# Patient Record
Sex: Female | Born: 1990 | Race: Black or African American | Hispanic: No | Marital: Married | Smoking: Never smoker
Health system: Southern US, Community
[De-identification: ages and names within clinical notes are randomized; demographics above are authoritative.]

## PROBLEM LIST (undated history)

## (undated) DIAGNOSIS — N83201 Unspecified ovarian cyst, right side: Secondary | ICD-10-CM

## (undated) DIAGNOSIS — K589 Irritable bowel syndrome without diarrhea: Secondary | ICD-10-CM

## (undated) DIAGNOSIS — D219 Benign neoplasm of connective and other soft tissue, unspecified: Secondary | ICD-10-CM

## (undated) DIAGNOSIS — N83202 Unspecified ovarian cyst, left side: Secondary | ICD-10-CM

---
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
## 2010-08-01 ENCOUNTER — Encounter: Payer: Self-pay | Admitting: Family Medicine

## 2010-09-26 LAB — URINALYSIS, ROUTINE W REFLEX MICROSCOPIC
Ketones, ur: NEGATIVE mg/dL
Nitrite: NEGATIVE
Protein, ur: NEGATIVE mg/dL
pH: 5.5 (ref 5.0–8.0)

## 2010-09-26 LAB — RAPID STREP SCREEN (MED CTR MEBANE ONLY): Streptococcus, Group A Screen (Direct): NEGATIVE

## 2010-09-28 LAB — URINALYSIS, ROUTINE W REFLEX MICROSCOPIC
Bilirubin Urine: NEGATIVE
Glucose, UA: NEGATIVE mg/dL
Hgb urine dipstick: NEGATIVE
Ketones, ur: NEGATIVE mg/dL
Protein, ur: NEGATIVE mg/dL
pH: 5.5 (ref 5.0–8.0)

## 2010-09-28 LAB — URINE CULTURE: Colony Count: 60000

## 2010-09-29 LAB — URINALYSIS, ROUTINE W REFLEX MICROSCOPIC
Bilirubin Urine: NEGATIVE
Glucose, UA: NEGATIVE mg/dL
Ketones, ur: NEGATIVE mg/dL
Nitrite: NEGATIVE
Specific Gravity, Urine: 1.02 (ref 1.005–1.030)
pH: 6 (ref 5.0–8.0)

## 2010-10-12 LAB — WET PREP, GENITAL: Clue Cells Wet Prep HPF POC: NONE SEEN

## 2010-10-14 LAB — CBC
HCT: 34.8 % — ABNORMAL LOW (ref 36.0–46.0)
HCT: 35.9 % — ABNORMAL LOW (ref 36.0–46.0)
Hemoglobin: 12.1 g/dL (ref 12.0–15.0)
MCHC: 34.5 g/dL (ref 30.0–36.0)
MCV: 82.2 fL (ref 78.0–100.0)
Platelets: 173 10*3/uL (ref 150–400)
Platelets: 206 10*3/uL (ref 150–400)
RBC: 4.36 MIL/uL (ref 3.87–5.11)
RDW: 13.9 % (ref 11.5–15.5)
RDW: 14.1 % (ref 11.5–15.5)
WBC: 8.5 10*3/uL (ref 4.0–10.5)

## 2010-10-14 LAB — URINE MICROSCOPIC-ADD ON

## 2010-10-14 LAB — URINALYSIS, ROUTINE W REFLEX MICROSCOPIC
Hgb urine dipstick: NEGATIVE
Ketones, ur: NEGATIVE mg/dL
Nitrite: NEGATIVE
Protein, ur: NEGATIVE mg/dL
Specific Gravity, Urine: 1.009 (ref 1.005–1.030)
Urobilinogen, UA: 1 mg/dL (ref 0.0–1.0)
pH: 7.5 (ref 5.0–8.0)

## 2010-10-14 LAB — RPR: RPR Ser Ql: NONREACTIVE

## 2010-10-14 LAB — WET PREP, GENITAL
Trich, Wet Prep: NONE SEEN
Yeast Wet Prep HPF POC: NONE SEEN

## 2010-10-14 LAB — URINE CULTURE

## 2010-10-14 LAB — HCG, QUANTITATIVE, PREGNANCY: hCG, Beta Chain, Quant, S: 4170 m[IU]/mL — ABNORMAL HIGH (ref <5)

## 2012-03-20 ENCOUNTER — Encounter (HOSPITAL_COMMUNITY): Payer: Self-pay | Admitting: Family Medicine

## 2012-03-20 ENCOUNTER — Emergency Department (HOSPITAL_COMMUNITY)
Admission: EM | Admit: 2012-03-20 | Discharge: 2012-03-20 | Disposition: A | Discharge status: Home / Self Care | Payer: Self-pay | Attending: Emergency Medicine | Admitting: Emergency Medicine

## 2012-03-20 DIAGNOSIS — B9689 Other specified bacterial agents as the cause of diseases classified elsewhere: Secondary | ICD-10-CM | POA: Insufficient documentation

## 2012-03-20 DIAGNOSIS — K589 Irritable bowel syndrome without diarrhea: Secondary | ICD-10-CM | POA: Insufficient documentation

## 2012-03-20 DIAGNOSIS — N390 Urinary tract infection, site not specified: Secondary | ICD-10-CM | POA: Insufficient documentation

## 2012-03-20 DIAGNOSIS — A499 Bacterial infection, unspecified: Secondary | ICD-10-CM | POA: Insufficient documentation

## 2012-03-20 DIAGNOSIS — N760 Acute vaginitis: Secondary | ICD-10-CM | POA: Insufficient documentation

## 2012-03-20 HISTORY — DX: Irritable bowel syndrome, unspecified: K58.9

## 2012-03-20 HISTORY — DX: Unspecified ovarian cyst, left side: N83.202

## 2012-03-20 HISTORY — DX: Unspecified ovarian cyst, left side: N83.201

## 2012-03-20 HISTORY — DX: Benign neoplasm of connective and other soft tissue, unspecified: D21.9

## 2012-03-20 LAB — WET PREP, GENITAL

## 2012-03-20 LAB — URINALYSIS, ROUTINE W REFLEX MICROSCOPIC
Glucose, UA: NEGATIVE mg/dL
Nitrite: NEGATIVE
pH: 6.5 (ref 5.0–8.0)

## 2012-03-20 LAB — URINE MICROSCOPIC-ADD ON

## 2012-03-20 LAB — POCT PREGNANCY, URINE: Preg Test, Ur: NEGATIVE

## 2012-03-20 MED ORDER — NITROFURANTOIN MONOHYD MACRO 100 MG PO CAPS: 100.0000 mg | ORAL_CAPSULE | Freq: Two times a day (BID) | ORAL | Status: AC

## 2012-03-20 MED ORDER — METRONIDAZOLE 500 MG PO TABS
2000.0000 mg | ORAL_TABLET | Freq: Once | ORAL | Status: AC
  Administered 2012-03-20: 2000 mg via ORAL
  Filled 2012-03-20: qty 4

## 2012-03-20 MED ORDER — PHENAZOPYRIDINE HCL 200 MG PO TABS: 200.0000 mg | ORAL_TABLET | Freq: Three times a day (TID) | ORAL | Status: AC

## 2012-03-20 MED ORDER — NITROFURANTOIN MONOHYD MACRO 100 MG PO CAPS
100.0000 mg | ORAL_CAPSULE | Freq: Once | ORAL | Status: AC
  Administered 2012-03-20: 100 mg via ORAL
  Filled 2012-03-20: qty 1

## 2012-03-20 MED ORDER — ONDANSETRON HCL 4 MG PO TABS
4.0000 mg | ORAL_TABLET | Freq: Once | ORAL | Status: AC
  Administered 2012-03-20: 4 mg via ORAL
  Filled 2012-03-20: qty 1

## 2012-03-20 MED ORDER — IBUPROFEN 800 MG PO TABS
800.0000 mg | ORAL_TABLET | Freq: Once | ORAL | Status: AC
  Administered 2012-03-20: 800 mg via ORAL
  Filled 2012-03-20: qty 1

## 2012-03-20 MED ORDER — PHENAZOPYRIDINE HCL 200 MG PO TABS
200.0000 mg | ORAL_TABLET | Freq: Three times a day (TID) | ORAL | Status: DC
  Administered 2012-03-20: 200 mg via ORAL
  Filled 2012-03-20: qty 1

## 2012-03-20 NOTE — ED Notes (Signed)
Reports nasal congestion, abdominal pain, urinary urgency, and blood when wiping x 2 days.  Reports N/V as well. States she is constipated, lbm x 2 days ago.

## 2012-03-20 NOTE — ED Provider Notes (Signed)
History     CSN: [id number]  Arrival date & time 03/20/12  1656   First MD Initiated Contact with Patient 03/20/12 1740      Chief Complaint  Patient presents with  . Nasal Congestion  . Urinary Urgency    (Consider location/radiation/quality/duration/timing/severity/associated sxs/prior treatment) HPI Julie Mathis is a 21 y.o. female presenting with mild-moderate abdominal pain, urgency, dysuria, frequency and blood noted on wiping x 2 days, symptoms have been constant.  She's had occasional nausea and vomiting but has been able to tolerate fluids.  History of constipation, last BM 2 days ago.    Past Medical History  Diagnosis Date  . IBS (irritable bowel syndrome)   . Bilateral ovarian cysts   . Fibroids     History reviewed. No pertinent past surgical history.  History reviewed. No pertinent family history.  History  Substance Use Topics  . Smoking status: Never Smoker   . Smokeless tobacco: Not on file  . Alcohol Use: Yes     occasionally    OB History    Grav Para Term Preterm Abortions TAB SAB Ect Mult Living                  Review of Systems At least 10pt or greater review of systems completed and are negative except where specified in the HPI.   Allergies  Review of patient's allergies indicates no known allergies.  Home Medications  No current outpatient prescriptions on file.  BP 113/75  Pulse 91  Temp 98.2 F (36.8 C) (Oral)  Resp 18  SpO2 100%  LMP 03/07/2012  Physical Exam  Nursing notes reviewed.  Electronic medical record reviewed. VITAL SIGNS:   Filed Vitals:   03/20/12 1704  BP: 113/75  Pulse: 91  Temp: 98.2 F (36.8 C)  TempSrc: Oral  Resp: 18  SpO2: 100%   CONSTITUTIONAL: Awake, oriented, appears non-toxic HENT: Atraumatic, normocephalic, oral mucosa pink and moist, airway patent. Nares patent without drainage. External ears normal. EYES: Conjunctiva clear, EOMI, PERRLA NECK: Trachea midline, non-tender,  supple CARDIOVASCULAR: Normal heart rate, Normal rhythm, No murmurs, rubs, gallops PULMONARY/CHEST: Clear to auscultation, no rhonchi, wheezes, or rales. Symmetrical breath sounds. Non-tender. ABDOMINAL: Non-distended, soft, non-tender - no rebound or guarding.  BS normal. NEUROLOGIC: Non-focal, moving all four extremities, no gross sensory or motor deficits. EXTREMITIES: No clubbing, cyanosis, or edema SKIN: Warm, Dry, No erythema, No rash Pelvic exam: normal external genitalia, vulva, vagina, cervix, uterus and adnexa.   ED Course  Procedures (including critical care time)  Labs Reviewed  URINALYSIS, ROUTINE W REFLEX MICROSCOPIC - Abnormal; Notable for the following:    APPearance TURBID (*)     Hgb urine dipstick LARGE (*)     Protein, ur 100 (*)     Leukocytes, UA LARGE (*)     All other components within normal limits  URINE MICROSCOPIC-ADD ON - Abnormal; Notable for the following:    Bacteria, UA FEW (*)     All other components within normal limits  WET PREP, GENITAL - Abnormal; Notable for the following:    Clue Cells Wet Prep HPF POC MANY (*)     WBC, Wet Prep HPF POC MANY (*)     All other components within normal limits  GC/CHLAMYDIA PROBE AMP, GENITAL - Abnormal; Notable for the following:    Chlamydia, DNA Probe POSITIVE (*)     All other components within normal limits  POCT PREGNANCY, URINE  URINE CULTURE  LAB REPORT - SCANNED  No results found.   1. UTI (lower urinary tract infection)   2. Bacterial vaginosis       MDM  [name] is a 21 y.o. female presenting with UTI and likely bacterial vaginosis.  Will treat for both.  PElvic exam not convinving for PID, swabs sent, with UTI present will wait for results of DNA probe before any further therapy.  Pt non-toxic. I explained the diagnosis and have given explicit precautions to return to the ER including high fever, severe abdominal pain, unresolving symptoms or any other new or worsening symptoms. The  patient understands and accepts the medical plan as it's been dictated and I have answered their questions. Discharge instructions concerning home care and prescriptions have been given.  The patient is STABLE and is discharged to home in good condition.   Medications  nitrofurantoin, macrocrystal-monohydrate, (MACROBID) 100 MG capsule (not administered)  phenazopyridine (PYRIDIUM) 200 MG tablet (not administered)  ondansetron (ZOFRAN) tablet 4 mg (4 mg Oral Given 03/20/12 1843)  ibuprofen (ADVIL,MOTRIN) tablet 800 mg (800 mg Oral Given 03/20/12 1844)  nitrofurantoin (macrocrystal-monohydrate) (MACROBID) capsule 100 mg (100 mg Oral Given 03/20/12 2035)  metroNIDAZOLE (FLAGYL) tablet 2,000 mg (2000 mg Oral Given 03/20/12 2219)      Jones Skene, MD 03/24/12 1756

## 2012-03-21 LAB — GC/CHLAMYDIA PROBE AMP, GENITAL: Chlamydia, DNA Probe: POSITIVE — AB

## 2012-03-22 NOTE — ED Notes (Signed)
+  Chlamydia Chart sent to EDP office for review.  

## 2012-03-23 LAB — URINE CULTURE: Colony Count: 80000

## 2012-03-24 NOTE — ED Notes (Signed)
+  Urine. Patient treated with Macrobid. Sensitive to same. Per protocol MD. °

## 2012-03-30 ENCOUNTER — Telehealth (HOSPITAL_COMMUNITY): Payer: Self-pay | Admitting: *Deleted

## 2012-03-30 NOTE — ED Notes (Signed)
Patient informed of positive results after id'd x 2 and informed of need to notify partner to be treated. Rx for Azithromycin 1 gram sig:one tablet po once # 1 written by Emilia Beck need to be called to pharmacy

## 2012-03-30 NOTE — ED Notes (Signed)
Rx called to pharmacy by [facility].

## 2013-08-03 ENCOUNTER — Encounter (HOSPITAL_COMMUNITY): Payer: Self-pay | Admitting: Emergency Medicine

## 2013-08-03 ENCOUNTER — Emergency Department (HOSPITAL_COMMUNITY)
Admission: EM | Admit: 2013-08-03 | Discharge: 2013-08-03 | Disposition: A | Discharge status: Home / Self Care | Payer: 59 | Attending: Emergency Medicine | Admitting: Emergency Medicine

## 2013-08-03 ENCOUNTER — Emergency Department (HOSPITAL_COMMUNITY): Payer: 59

## 2013-08-03 DIAGNOSIS — Z79899 Other long term (current) drug therapy: Secondary | ICD-10-CM | POA: Insufficient documentation

## 2013-08-03 DIAGNOSIS — S0003XA Contusion of scalp, initial encounter: Secondary | ICD-10-CM | POA: Insufficient documentation

## 2013-08-03 DIAGNOSIS — S1093XA Contusion of unspecified part of neck, initial encounter: Principal | ICD-10-CM

## 2013-08-03 DIAGNOSIS — S0083XA Contusion of other part of head, initial encounter: Secondary | ICD-10-CM

## 2013-08-03 DIAGNOSIS — Y9389 Activity, other specified: Secondary | ICD-10-CM | POA: Insufficient documentation

## 2013-08-03 DIAGNOSIS — Y92009 Unspecified place in unspecified non-institutional (private) residence as the place of occurrence of the external cause: Secondary | ICD-10-CM | POA: Insufficient documentation

## 2013-08-03 DIAGNOSIS — IMO0002 Reserved for concepts with insufficient information to code with codable children: Secondary | ICD-10-CM | POA: Insufficient documentation

## 2013-08-03 DIAGNOSIS — F411 Generalized anxiety disorder: Secondary | ICD-10-CM | POA: Insufficient documentation

## 2013-08-03 DIAGNOSIS — Z8742 Personal history of other diseases of the female genital tract: Secondary | ICD-10-CM | POA: Insufficient documentation

## 2013-08-03 DIAGNOSIS — Z8719 Personal history of other diseases of the digestive system: Secondary | ICD-10-CM | POA: Insufficient documentation

## 2013-08-03 MED ORDER — BACLOFEN 10 MG PO TABS: 10.0000 mg | ORAL_TABLET | Freq: Three times a day (TID) | ORAL | Status: DC

## 2013-08-03 MED ORDER — IBUPROFEN 800 MG PO TABS
800.0000 mg | ORAL_TABLET | Freq: Once | ORAL | Status: AC
  Administered 2013-08-03: 800 mg via ORAL
  Filled 2013-08-03: qty 1

## 2013-08-03 MED ORDER — DIAZEPAM 5 MG PO TABS
5.0000 mg | ORAL_TABLET | Freq: Once | ORAL | Status: AC
  Administered 2013-08-03: 5 mg via ORAL
  Filled 2013-08-03: qty 1

## 2013-08-03 NOTE — ED Notes (Signed)
Patient hit herself with door and hit jaw on the right side.  Patient having a lot of pain at this time in her jaw.

## 2013-08-03 NOTE — ED Provider Notes (Signed)
Medical screening examination/treatment/procedure(s) were performed by non-physician practitioner and as supervising physician I was immediately available for consultation/collaboration.  EKG Interpretation   None        Teressa Lower, MD 08/03/13 2255
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
## 2013-08-03 NOTE — ED Notes (Signed)
Patient discharged to home with friend. NAD.

## 2013-08-03 NOTE — Discharge Instructions (Signed)
Facial or Scalp Contusion  A facial or scalp contusion is a deep bruise on the face or head. Injuries to the face and head generally cause a lot of swelling, especially around the eyes. Contusions are the result of an injury that caused bleeding under the skin. The contusion may turn blue, purple, or yellow. Minor injuries will give you a painless contusion, but more severe contusions may stay painful and swollen for a few weeks.   CAUSES   A facial or scalp contusion is caused by a blunt injury or trauma to the face or head area.   SIGNS AND SYMPTOMS   · Swelling of the injured area.    · Discoloration of the injured area.    · Tenderness, soreness, or pain in the injured area.    DIAGNOSIS   The diagnosis can be made by taking a medical history and doing a physical exam. An X-ray exam, CT scan, or MRI may be needed to determine if there are any associated injuries, such as broken bones (fractures).  TREATMENT   Often, the best treatment for a facial or scalp contusion is applying cold compresses to the injured area. Over-the-counter medicines may also be recommended for pain control.   HOME CARE INSTRUCTIONS   · Only take over-the-counter or prescription medicines as directed by your health care provider.    · Apply ice to the injured area.    · Put ice in a plastic bag.    · Place a towel between your skin and the bag.    · Leave the ice on for 20 minutes, 2 3 times a day.    SEEK MEDICAL CARE IF:  · You have bite problems.    · You have pain with chewing.    · You are concerned about facial defects.  SEEK IMMEDIATE MEDICAL CARE IF:  · You have severe pain or a headache that is not relieved by medicine.    · You have unusual sleepiness, confusion, or personality changes.    · You throw up (vomit).    · You have a persistent nosebleed.    · You have double vision or blurred vision.    · You have fluid drainage from your nose or ear.    · You have difficulty walking or using your arms or legs.    MAKE SURE YOU:    · Understand these instructions.  · Will watch your condition.  · Will get help right away if you are not doing well or get worse.  Document Released: 08/04/2004 Document Revised: 04/17/2013 Document Reviewed: 02/07/2013  ExitCare® Patient Information ©2014 [employer].

## 2014-05-03 ENCOUNTER — Emergency Department (HOSPITAL_COMMUNITY)
Admission: EM | Admit: 2014-05-03 | Discharge: 2014-05-03 | Disposition: A | Discharge status: Home / Self Care | Payer: 59 | Attending: Emergency Medicine | Admitting: Emergency Medicine

## 2014-05-03 ENCOUNTER — Encounter (HOSPITAL_COMMUNITY): Payer: Self-pay | Admitting: Emergency Medicine

## 2014-05-03 ENCOUNTER — Emergency Department (HOSPITAL_COMMUNITY): Payer: 59

## 2014-05-03 DIAGNOSIS — S59902A Unspecified injury of left elbow, initial encounter: Secondary | ICD-10-CM | POA: Diagnosis not present

## 2014-05-03 DIAGNOSIS — Y92410 Unspecified street and highway as the place of occurrence of the external cause: Secondary | ICD-10-CM | POA: Diagnosis not present

## 2014-05-03 DIAGNOSIS — S29091A Other injury of muscle and tendon of front wall of thorax, initial encounter: Secondary | ICD-10-CM | POA: Insufficient documentation

## 2014-05-03 DIAGNOSIS — Z8719 Personal history of other diseases of the digestive system: Secondary | ICD-10-CM | POA: Diagnosis not present

## 2014-05-03 DIAGNOSIS — Z8742 Personal history of other diseases of the female genital tract: Secondary | ICD-10-CM | POA: Insufficient documentation

## 2014-05-03 DIAGNOSIS — Z79899 Other long term (current) drug therapy: Secondary | ICD-10-CM | POA: Insufficient documentation

## 2014-05-03 DIAGNOSIS — S3991XA Unspecified injury of abdomen, initial encounter: Secondary | ICD-10-CM | POA: Diagnosis not present

## 2014-05-03 DIAGNOSIS — Y9389 Activity, other specified: Secondary | ICD-10-CM | POA: Insufficient documentation

## 2014-05-03 DIAGNOSIS — S1091XA Abrasion of unspecified part of neck, initial encounter: Secondary | ICD-10-CM | POA: Diagnosis not present

## 2014-05-03 DIAGNOSIS — S4992XA Unspecified injury of left shoulder and upper arm, initial encounter: Secondary | ICD-10-CM | POA: Diagnosis not present

## 2014-05-03 DIAGNOSIS — S299XXA Unspecified injury of thorax, initial encounter: Secondary | ICD-10-CM | POA: Diagnosis present

## 2014-05-03 MED ORDER — IBUPROFEN 600 MG PO TABS: 600.0000 mg | ORAL_TABLET | Freq: Three times a day (TID) | ORAL | Status: DC

## 2014-05-03 MED ORDER — TRAMADOL HCL 50 MG PO TABS: 50.0000 mg | ORAL_TABLET | Freq: Four times a day (QID) | ORAL | Status: DC | PRN

## 2014-05-03 MED ORDER — KETOROLAC TROMETHAMINE 30 MG/ML IJ SOLN
30.0000 mg | Freq: Once | INTRAMUSCULAR | Status: AC
  Administered 2014-05-03: 30 mg via INTRAMUSCULAR
  Filled 2014-05-03: qty 1

## 2014-05-03 MED ORDER — DIAZEPAM 5 MG PO TABS: 5.0000 mg | ORAL_TABLET | Freq: Two times a day (BID) | ORAL | Status: DC

## 2014-05-03 NOTE — ED Notes (Signed)
Patient transported to X-ray 
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
## 2014-05-29 ENCOUNTER — Ambulatory Visit: Payer: 59

## 2014-07-14 ENCOUNTER — Ambulatory Visit (INDEPENDENT_AMBULATORY_CARE_PROVIDER_SITE_OTHER): Payer: Self-pay | Admitting: Family Medicine

## 2014-07-14 VITALS — BP 108/68 | HR 79 | Temp 97.6°F | Resp 16 | Ht 66.5 in | Wt 194.0 lb

## 2014-07-14 DIAGNOSIS — Z025 Encounter for examination for participation in sport: Secondary | ICD-10-CM
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
## 2014-07-17 ENCOUNTER — Telehealth: Payer: Self-pay

## 2014-07-17 NOTE — Telephone Encounter (Signed)
Please advise if letter is acceptable to write for pt or if she needs an OV
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
## 2014-07-18 ENCOUNTER — Other Ambulatory Visit: Payer: Self-pay | Admitting: Family Medicine

## 2014-07-18 ENCOUNTER — Encounter: Payer: Self-pay | Admitting: Family Medicine

## 2014-07-18 NOTE — Telephone Encounter (Signed)
I am gonna send this to Dr Joseph Art, he evaluated the patient. So if a letter is warranted then he can sign off on it. Thanks Dr Marin Comment

## 2014-07-19 ENCOUNTER — Telehealth: Payer: Self-pay

## 2014-07-19 NOTE — Telephone Encounter (Signed)
Pt called wanting me to fax a note from Dr. Joseph Art to the [facility] concerning a full unrestricted license. He had mailed it to her. I faxed it off. The fax number is [phone]

## 2015-09-03 ENCOUNTER — Encounter (HOSPITAL_COMMUNITY): Payer: Self-pay | Admitting: Family Medicine

## 2015-09-03 ENCOUNTER — Emergency Department (HOSPITAL_COMMUNITY)
Admission: EM | Admit: 2015-09-03 | Discharge: 2015-09-03 | Disposition: A | Discharge status: Home / Self Care | Payer: 59 | Attending: Emergency Medicine | Admitting: Emergency Medicine

## 2015-09-03 DIAGNOSIS — R1084 Generalized abdominal pain: Secondary | ICD-10-CM | POA: Insufficient documentation

## 2015-09-03 DIAGNOSIS — R109 Unspecified abdominal pain: Secondary | ICD-10-CM

## 2015-09-03 DIAGNOSIS — Z8742 Personal history of other diseases of the female genital tract: Secondary | ICD-10-CM | POA: Diagnosis not present

## 2015-09-03 DIAGNOSIS — Z3201 Encounter for pregnancy test, result positive: Secondary | ICD-10-CM | POA: Insufficient documentation

## 2015-09-03 DIAGNOSIS — M545 Low back pain: Secondary | ICD-10-CM | POA: Insufficient documentation

## 2015-09-03 DIAGNOSIS — O26899 Other specified pregnancy related conditions, unspecified trimester: Secondary | ICD-10-CM

## 2015-09-03 DIAGNOSIS — Z86018 Personal history of other benign neoplasm: Secondary | ICD-10-CM | POA: Diagnosis not present

## 2015-09-03 DIAGNOSIS — R112 Nausea with vomiting, unspecified: Secondary | ICD-10-CM | POA: Insufficient documentation

## 2015-09-03 DIAGNOSIS — R1031 Right lower quadrant pain: Secondary | ICD-10-CM | POA: Insufficient documentation

## 2015-09-03 DIAGNOSIS — Z8719 Personal history of other diseases of the digestive system: Secondary | ICD-10-CM | POA: Diagnosis not present

## 2015-09-03 DIAGNOSIS — R1032 Left lower quadrant pain: Secondary | ICD-10-CM | POA: Diagnosis present

## 2015-09-03 DIAGNOSIS — Z79899 Other long term (current) drug therapy: Secondary | ICD-10-CM | POA: Insufficient documentation

## 2015-09-03 LAB — URINALYSIS, ROUTINE W REFLEX MICROSCOPIC
Bilirubin Urine: NEGATIVE
GLUCOSE, UA: NEGATIVE mg/dL
Hgb urine dipstick: NEGATIVE
Ketones, ur: NEGATIVE mg/dL
LEUKOCYTES UA: NEGATIVE
NITRITE: NEGATIVE
PROTEIN: NEGATIVE mg/dL
Specific Gravity, Urine: 1.012 (ref 1.005–1.030)
pH: 7 (ref 5.0–8.0)

## 2015-09-03 LAB — COMPREHENSIVE METABOLIC PANEL
ALBUMIN: 3.5 g/dL (ref 3.5–5.0)
ALK PHOS: 60 U/L (ref 38–126)
ALT: 16 U/L (ref 14–54)
ANION GAP: 7 (ref 5–15)
AST: 17 U/L (ref 15–41)
BILIRUBIN TOTAL: 0.6 mg/dL (ref 0.3–1.2)
BUN: 6 mg/dL (ref 6–20)
CALCIUM: 9.2 mg/dL (ref 8.9–10.3)
CO2: 22 mmol/L (ref 22–32)
Chloride: 109 mmol/L (ref 101–111)
Creatinine, Ser: 0.96 mg/dL (ref 0.44–1.00)
GFR calc Af Amer: >60 mL/min (ref >60)
GFR calc non Af Amer: >60 mL/min (ref >60)
GLUCOSE: 92 mg/dL (ref 65–99)
POTASSIUM: 4.6 mmol/L (ref 3.5–5.1)
SODIUM: 138 mmol/L (ref 135–145)
Total Protein: 7.1 g/dL (ref 6.5–8.1)

## 2015-09-03 LAB — CBC
HCT: 34.7 % — ABNORMAL LOW (ref 36.0–46.0)
Hemoglobin: 11.2 g/dL — ABNORMAL LOW (ref 12.0–15.0)
MCH: 24.2 pg — ABNORMAL LOW (ref 26.0–34.0)
MCHC: 32.3 g/dL (ref 30.0–36.0)
MCV: 75.1 fL — AB (ref 78.0–100.0)
PLATELETS: 206 10*3/uL (ref 150–400)
RBC: 4.62 MIL/uL (ref 3.87–5.11)
RDW: 15.1 % (ref 11.5–15.5)
WBC: 6.5 10*3/uL (ref 4.0–10.5)

## 2015-09-03 LAB — I-STAT BETA HCG BLOOD, ED (MC, WL, AP ONLY): HCG, QUANTITATIVE: 806.7 m[IU]/mL — AB (ref <5)

## 2015-09-03 LAB — POC URINE PREG, ED: PREG TEST UR: POSITIVE — AB

## 2015-09-03 LAB — LIPASE, BLOOD: Lipase: 34 U/L (ref 11–51)

## 2015-09-03 NOTE — ED Notes (Signed)
Pt here for lower abd pain with gas and bloating "for a while". sts some constipation. Denies diarrhea. sts 3 days ago N,V. Denies any vaginal bleeding or discharge.

## 2015-09-03 NOTE — ED Provider Notes (Signed)
CSN: [id number]     Arrival date & time 09/03/15  1032 History  By signing my name below, I, Eustaquio Maize, attest that this documentation has been prepared under the direction and in the presence of Kriss Perleberg, PA-C.  Electronically Signed: Eustaquio Maize, ED Scribe. 09/03/2015. 1:49 PM.   Chief Complaint  Patient presents with  . Abdominal Pain   The history is provided by the patient. No language interpreter was used.     HPI Comments: Julie Mathis is a 25 y.o. female who presents to the Emergency Department complaining of gradual onset, constant, lower abdominal pain, left greater than right, x "awhile." She also complains of lower back pain. Pt mentions having vomiting once last week and once 3 days ago, both in the morning. She has hx of IBS and reports hard stools once per day. Pt states she has tried to keep well hydrated. Denies abnormal vaginal discharge, vaginal bleeding, hematochezia, or any other associated symptoms. LNMP: 08/07/2015. G1P1.    Past Medical History  Diagnosis Date  . IBS (irritable bowel syndrome)   . Bilateral ovarian cysts   . Fibroids    History reviewed. No pertinent past surgical history. Family History  Problem Relation Age of Onset  . Cancer Paternal Grandmother    Social History  Substance Use Topics  . Smoking status: Never Smoker   . Smokeless tobacco: None  . Alcohol Use: Yes     Comment: every day   OB History    No data available     Review of Systems  Gastrointestinal: Positive for vomiting and abdominal pain. Negative for blood in stool.  Genitourinary: Negative for vaginal bleeding and vaginal discharge.  Musculoskeletal: Positive for back pain.   Allergies  Review of patient's allergies indicates no known allergies.  Home Medications   Prior to Admission medications   Medication Sig Start Date End Date Taking? Authorizing Provider  Multiple Vitamins-Minerals (HAIR/SKIN/NAILS PO) Take 1 tablet by mouth daily.     Historical Provider, MD   BP 130/80 mmHg  Pulse 89  Temp(Src) 98.4 F (36.9 C) (Oral)  Resp 20  SpO2 100%  LMP 08/10/2015   Physical Exam  Constitutional: She is oriented to person, place, and time. She appears well-developed and well-nourished. No distress.  HENT:  Head: Normocephalic and atraumatic.  Eyes: Conjunctivae and EOM are normal.  Neck: Neck supple. No tracheal deviation present.  Cardiovascular: Normal rate, regular rhythm and normal heart sounds.   Pulmonary/Chest: Effort normal and breath sounds normal. No respiratory distress. She has no wheezes.  Abdominal:  Diffuse tenderness with worse tenderness in the suprapubic area  Musculoskeletal: Normal range of motion.  Neurological: She is alert and oriented to person, place, and time.  Skin: Skin is warm and dry.  Psychiatric: She has a normal mood and affect. Her behavior is normal.  Nursing note and vitals reviewed.   ED Course  Procedures (including critical care time)  DIAGNOSTIC STUDIES: Oxygen Saturation is 100% on RA, normal by my interpretation.    COORDINATION OF CARE: 1:47 PM-Discussed treatment plan which includes urine pregnancy with pt at bedside and pt agreed to plan.   Labs Review Labs Reviewed  CBC - Abnormal; Notable for the following:    Hemoglobin 11.2 (*)    HCT 34.7 (*)    MCV 75.1 (*)    MCH 24.2 (*)    All other components within normal limits  LIPASE, BLOOD  COMPREHENSIVE METABOLIC PANEL  URINALYSIS, ROUTINE W REFLEX MICROSCOPIC (  NOT AT [facility])  I-STAT BETA HCG BLOOD, ED ([facility], [facility], [facility] ONLY)  POC URINE PREG, ED    Imaging Review No results found. I have personally reviewed and evaluated these lab results as part of my medical decision-making.   EKG Interpretation None      MDM   Final diagnoses:  Abdominal pain in pregnancy   Patient with abdominal pain for several days with associated nausea and vomiting that is worse in the morning. Patient's abdominal exam is diffusely  tender, with no guarding or rebound tenderness. Patient is concerned she may be pregnant. Blood work and pregnancy test obtained which showed positive pregnancy result. Patient states her last menstrual cycle was 3 weeks ago. She states her period was normal at that time. This most likely early pregnancy. Since she's having so much cramping, advised that there could be complications with pregnancy, such as ectopic pregnancy. I advised her that we need to do pelvic exam and pelvic ultrasound. Patient refused stating that she will follow up with her OB/GYN. I discussed return precautions with her and she voiced understanding. At this time she is hemodynamically stable, there is no specific adnexal tenderness on exam, stable for discharge home.  Filed Vitals:   09/03/15 1100  BP: 130/80  Pulse: 89  Temp: 98.4 F (36.9 C)  TempSrc: Oral  Resp: 20  SpO2: 100%     Jeannett Senior, PA-C 09/03/15 [place], DO 09/04/15 0930

## 2015-09-03 NOTE — Discharge Instructions (Signed)
*  Prenatal vitamins, take daily. Take Tylenol for any cramping. He refused pelvic ultrasound and exam today, please make sure to follow-up with her OB/GYN for close recheck and further prenatal care. Return to the hospital if any heavy vaginal bleeding, increased pain, new concerning symptoms.  Abdominal Pain During Pregnancy Abdominal pain is common in pregnancy. Most of the time, it does not cause harm. There are many causes of abdominal pain. Some causes are more serious than others. Some of the causes of abdominal pain in pregnancy are easily diagnosed. Occasionally, the diagnosis takes time to understand. Other times, the cause is not determined. Abdominal pain can be a sign that something is very wrong with the pregnancy, or the pain may have nothing to do with the pregnancy at all. For this reason, always tell your health care provider if you have any abdominal discomfort. HOME CARE INSTRUCTIONS  Monitor your abdominal pain for any changes. The following actions may help to alleviate any discomfort you are experiencing:  Do not have sexual intercourse or put anything in your vagina until your symptoms go away completely.  Get plenty of rest until your pain improves.  Drink clear fluids if you feel nauseous. Avoid solid food as long as you are uncomfortable or nauseous.  Only take over-the-counter or prescription medicine as directed by your health care provider.  Keep all follow-up appointments with your health care provider. SEEK IMMEDIATE MEDICAL CARE IF:  You are bleeding, leaking fluid, or passing tissue from the vagina.  You have increasing pain or cramping.  You have persistent vomiting.  You have painful or bloody urination.  You have a fever.  You notice a decrease in your baby's movements.  You have extreme weakness or feel faint.  You have shortness of breath, with or without abdominal pain.  You develop a severe headache with abdominal pain.  You have abnormal  vaginal discharge with abdominal pain.  You have persistent diarrhea.  You have abdominal pain that continues even after rest, or gets worse. MAKE SURE YOU:   Understand these instructions.  Will watch your condition.  Will get help right away if you are not doing well or get worse.   This information is not intended to replace advice given to you by your health care provider. Make sure you discuss any questions you have with your health care provider.   Document Released: 06/27/2005 Document Revised: 04/17/2013 Document Reviewed: 01/24/2013 Elsevier Interactive Patient Education [employer].

## 2015-12-13 ENCOUNTER — Emergency Department (HOSPITAL_COMMUNITY)
Admission: EM | Admit: 2015-12-13 | Discharge: 2015-12-14 | Disposition: A | Discharge status: Home / Self Care | Payer: Self-pay | Attending: Emergency Medicine | Admitting: Emergency Medicine

## 2015-12-13 ENCOUNTER — Encounter (HOSPITAL_COMMUNITY): Payer: Self-pay | Admitting: Emergency Medicine

## 2015-12-13 DIAGNOSIS — K5900 Constipation, unspecified: Secondary | ICD-10-CM

## 2015-12-13 NOTE — ED Notes (Signed)
Pt from home with complaints of constipation x 5-6 days. Pt states she has tried mirolax and castor oil with no relief. Pt states she has a hx of IBS and she is currently feeling bloated, but is able to pass gas.

## 2015-12-14 ENCOUNTER — Emergency Department (HOSPITAL_COMMUNITY): Payer: Self-pay

## 2015-12-14 LAB — POC URINE PREG, ED: Preg Test, Ur: NEGATIVE

## 2015-12-14 MED ORDER — FLEET ENEMA 7-19 GM/118ML RE ENEM: 1.0000 | ENEMA | Freq: Every day | RECTAL | Status: DC | PRN

## 2015-12-14 MED ORDER — POLYETHYLENE GLYCOL 3350 17 GM/SCOOP PO POWD: 1.0000 | Freq: Every day | ORAL | Status: DC

## 2015-12-14 MED ORDER — SORBITOL 70 % PO SOLN: 15.0000 mL | Freq: Every day | ORAL | Status: DC | PRN

## 2015-12-14 MED ORDER — MILK AND MOLASSES ENEMA
1.0000 | Freq: Once | RECTAL | Status: AC
  Administered 2015-12-14: 250 mL via RECTAL
  Filled 2015-12-14: qty 250

## 2015-12-14 NOTE — ED Notes (Signed)
Gave patient information on high fiber diet per patients request for information

## 2015-12-14 NOTE — Discharge Instructions (Signed)
Constipation, Adult Constipation is when a person has fewer than three bowel movements a week, has difficulty having a bowel movement, or has stools that are dry, hard, or larger than normal. As people grow older, constipation is more common. A low-fiber diet, not taking in enough fluids, and taking certain medicines may make constipation worse.  CAUSES   Certain medicines, such as antidepressants, pain medicine, iron supplements, antacids, and water pills.   Certain diseases, such as diabetes, irritable bowel syndrome (IBS), thyroid disease, or depression.   Not drinking enough water.   Not eating enough fiber-rich foods.   Stress or travel.   Lack of physical activity or exercise.   Ignoring the urge to have a bowel movement.   Using laxatives too much.  SIGNS AND SYMPTOMS   Having fewer than three bowel movements a week.   Straining to have a bowel movement.   Having stools that are hard, dry, or larger than normal.   Feeling full or bloated.   Pain in the lower abdomen.   Not feeling relief after having a bowel movement.  DIAGNOSIS  Your health care provider will take a medical history and perform a physical exam. Further testing may be done for severe constipation. Some tests may include:  A barium enema X-ray to examine your rectum, colon, and, sometimes, your small intestine.   A sigmoidoscopy to examine your lower colon.   A colonoscopy to examine your entire colon. TREATMENT  Treatment will depend on the severity of your constipation and what is causing it. Some dietary treatments include drinking more fluids and eating more fiber-rich foods. Lifestyle treatments may include regular exercise. If these diet and lifestyle recommendations do not help, your health care provider may recommend taking over-the-counter laxative medicines to help you have bowel movements. Prescription medicines may be prescribed if over-the-counter medicines do not work.    HOME CARE INSTRUCTIONS   Eat foods that have a lot of fiber, such as fruits, vegetables, whole grains, and beans.  Limit foods high in fat and processed sugars, such as french fries, hamburgers, cookies, candies, and soda.   A fiber supplement may be added to your diet if you cannot get enough fiber from foods.   Drink enough fluids to keep your urine clear or pale yellow.   Exercise regularly or as directed by your health care provider.   Go to the restroom when you have the urge to go. Do not hold it.   Only take over-the-counter or prescription medicines as directed by your health care provider. Do not take other medicines for constipation without talking to your health care provider first.  [place] IF:   You have bright red blood in your stool.   Your constipation lasts for more than 4 days or gets worse.   You have abdominal or rectal pain.   You have thin, pencil-like stools.   You have unexplained weight loss. MAKE SURE YOU:   Understand these instructions.  Will watch your condition.  Will get help right away if you are not doing well or get worse.   This information is not intended to replace advice given to you by your health care provider. Make sure you discuss any questions you have with your health care provider.   Document Released: 03/25/2004 Document Revised: 07/18/2014 Document Reviewed: 04/08/2013 Elsevier Interactive Patient Education 2016 [place].  Diet for Irritable Bowel Syndrome When you have irritable bowel syndrome (IBS), the foods you eat and your eating  habits are very important. IBS may cause various symptoms, such as abdominal pain, constipation, or diarrhea. Choosing the right foods can help ease discomfort caused by these symptoms. Work with your health care provider and dietitian to find the best eating plan to help control your symptoms. WHAT GENERAL GUIDELINES DO I NEED TO FOLLOW?  Keep a food diary.  This will help you identify foods that cause symptoms. Write down:  What you eat and when.  What symptoms you have.  When symptoms occur in relation to your meals.  Avoid foods that cause symptoms. Talk with your dietitian about other ways to get the same nutrients that are in these foods.  Eat more foods that contain fiber. Take a fiber supplement if directed by your dietitian.  Eat your meals slowly, in a relaxed setting.  Aim to eat 5-6 small meals per day. Do not skip meals.  Drink enough fluids to keep your urine clear or pale yellow.  Ask your health care provider if you should take an over-the-counter probiotic during flare-ups to help restore healthy gut bacteria.  If you have cramping or diarrhea, try making your meals low in fat and high in carbohydrates. Examples of carbohydrates are pasta, rice, whole grain breads and cereals, fruits, and vegetables.  If dairy products cause your symptoms to flare up, try eating less of them. You might be able to handle yogurt better than other dairy products because it contains bacteria that help with digestion. WHAT FOODS ARE NOT RECOMMENDED? The following are some foods and drinks that may worsen your symptoms:  Fatty foods, such as [place] fries.  Milk products, such as cheese or ice cream.  Chocolate.  Alcohol.  Products with caffeine, such as coffee.  Carbonated drinks, such as soda. The items listed above may not be a complete list of foods and beverages to avoid. Contact your dietitian for more information. WHAT FOODS ARE GOOD SOURCES OF FIBER? Your health care provider or dietitian may recommend that you eat more foods that contain fiber. Fiber can help reduce constipation and other IBS symptoms. Add foods with fiber to your diet a little at a time so that your body can get used to them. Too much fiber at once might cause gas and swelling of your abdomen. The following are some foods that are good sources of  fiber:  Apples.  Peaches.  Pears.  Berries.  Figs.  Broccoli (raw).  Cabbage.  Carrots.  Raw peas.  Kidney beans.  Lima beans.  Whole grain bread.  Whole grain cereal. FOR MORE INFORMATION  International Foundation for Functional Gastrointestinal Disorders: www.iffgd.[employer] and Digestive [employer] Kidney [employer]: NetworkAffair.co.za.aspx   This information is not intended to replace advice given to you by your health care provider. Make sure you discuss any questions you have with your health care provider.   Document Released: 09/17/2003 Document Revised: 07/18/2014 Document Reviewed: 09/27/2013 Elsevier Interactive Patient Education 2016 Elsevier Inc[employer]  Irritable Bowel Syndrome, Adult Irritable bowel syndrome (IBS) is not one specific disease. It is a group of symptoms that affects the organs responsible for digestion (gastrointestinal or GI tract).  To regulate how your GI tract works, your body sends signals back and forth between your intestines and your brain. If you have IBS, there may be a problem with these signals. As a result, your GI tract does not function normally. Your intestines may become more sensitive and overreact to certain things. This is especially true when you eat certain  foods or when you are under stress.  There are four types of IBS. These may be determined based on the consistency of your stool:   IBS with diarrhea.   IBS with constipation.   Mixed IBS.   Unsubtyped IBS.  It is important to know which type of IBS you have. Some treatments are more likely to be helpful for certain types of IBS.  CAUSES  The exact cause of IBS is not known. RISK FACTORS You may have a higher risk of IBS if:  You are a woman.  You are younger than 25 years old.  You have a family history of IBS.  You have mental health problems.  You have had bacterial  infection of your GI tract. SIGNS AND SYMPTOMS  Symptoms of IBS vary from person to person. The main symptom is abdominal pain or discomfort. Additional symptoms usually include one or more of the following:   Diarrhea, constipation, or both.   Abdominal swelling or bloating.   Feeling full or sick after eating a small or regular-size meal.   Frequent gas.   Mucus in the stool.   A feeling of having more stool left after a bowel movement.  Symptoms tend to come and go. They may be associated with stress, psychiatric conditions, or nothing at all.  DIAGNOSIS  There is no specific test to diagnose IBS. Your health care provider will make a diagnosis based on a physical exam, medical history, and your symptoms. You may have other tests to rule out other conditions that may be causing your symptoms. These may include:   Blood tests.   X-rays.   CT scan.  Endoscopy and colonoscopy. This is a test in which your GI tract is viewed with a long, thin, flexible tube. TREATMENT There is no cure for IBS, but treatment can help relieve symptoms. IBS treatment often includes:   Changes to your diet, such as:  Eating more fiber.  Avoiding foods that cause symptoms.  Drinking more water.  Eating regular, medium-sized portioned meals.  Medicines. These may include:  Fiber supplements if you have constipation.  Medicine to control diarrhea (antidiarrheal medicines).  Medicine to help control muscle spasms in your GI tract (antispasmodic medicines).  Medicines to help with any mental health issues, such as antidepressants or tranquilizers.  Therapy.  Talk therapy may help with anxiety, depression, or other mental health issues that can make IBS symptoms worse.  Stress reduction.  Managing your stress can help keep symptoms under control. HOME CARE INSTRUCTIONS   Take medicines only as directed by your health care provider.  Eat a healthy diet.  Avoid foods and  drinks with added sugar.  Include more whole grains, fruits, and vegetables gradually into your diet. This may be especially helpful if you have IBS with constipation.  Avoid any foods and drinks that make your symptoms worse. These may include dairy products and caffeinated or carbonated drinks.  Do not eat large meals.  Drink enough fluid to keep your urine clear or pale yellow.  Exercise regularly. Ask your health care provider for recommendations of good activities for you.  Keep all follow-up visits as directed by your health care provider. This is important. SEEK MEDICAL CARE IF:   You have constant pain.  You have trouble or pain with swallowing.  You have worsening diarrhea. SEEK IMMEDIATE MEDICAL CARE IF:   You have severe and worsening abdominal pain.   You have diarrhea and:   You have a rash,  stiff neck, or severe headache.   You are irritable, sleepy, or difficult to awaken.   You are weak, dizzy, or extremely thirsty.   You have bright red blood in your stool or you have black tarry stools.   You have unusual abdominal swelling that is painful.   You vomit continuously.   You vomit blood (hematemesis).   You have both abdominal pain and a fever.    This information is not intended to replace advice given to you by your health care provider. Make sure you discuss any questions you have with your health care provider.   Document Released: 06/27/2005 Document Revised: 07/18/2014 Document Reviewed: 03/14/2014 Elsevier Interactive Patient Education [employer].

## 2015-12-14 NOTE — ED Notes (Signed)
Patient states she only had gas.

## 2015-12-14 NOTE — ED Provider Notes (Signed)
CSN: [id number]     Arrival date & time 12/13/15  2112 History   First MD Initiated Contact with Patient 12/14/15 0044     Chief Complaint  Patient presents with  . Constipation     (Consider location/radiation/quality/duration/timing/severity/associated sxs/prior Treatment) HPI  Patient has a hx of IBS, fibroids, and bilateral ovarian cysts comes to the ER with complaints of constipation for 5-6 days. She says she had the same problem in high school and ended up getting admitted to the hospital. Denies needing bowel surgery. She has tried miralax and castor oil wo relief. She has not tried any enemas or suppositories. She has been able to pass gas. She denies rectal bleeding, N/V/D. She admits to having abdominal distention and discomfort.   Past Medical History  Diagnosis Date  . IBS (irritable bowel syndrome)   . Bilateral ovarian cysts   . Fibroids    History reviewed. No pertinent past surgical history. Family History  Problem Relation Age of Onset  . Cancer Paternal Grandmother    Social History  Substance Use Topics  . Smoking status: Never Smoker   . Smokeless tobacco: None  . Alcohol Use: Yes     Comment: every day   OB History    No data available     Review of Systems  Review of Systems All other systems negative except as documented in the HPI. All pertinent positives and negatives as reviewed in the HPI.   Allergies  Review of patient's allergies indicates no known allergies.  Home Medications   Prior to Admission medications   Medication Sig Start Date End Date Taking? Authorizing Provider  Etonogestrel (NEXPLANON [place]) Inject into the skin.   Yes Historical Provider, MD  Multiple Vitamins-Minerals (HAIR/SKIN/NAILS PO) Take 1 tablet by mouth daily.   Yes Historical Provider, MD  polyethylene glycol powder (MIRALAX) powder Take 255 g by mouth daily. 12/14/15   Rea Kalama Carlota Raspberry, PA-C  sodium phosphate (FLEET) 7-19 GM/118ML ENEM Place 133 mLs (1 enema total)  rectally daily as needed for severe constipation. 12/14/15   Demira Gwynne Carlota Raspberry, PA-C  sorbitol 70 % solution Take 15 mLs by mouth daily as needed. 12/14/15   Nazaria Riesen Carlota Raspberry, PA-C   BP 120/79 mmHg  Pulse 81  Temp(Src) 98.8 F (37.1 C) (Oral)  Resp 16  Ht 5\' 3"  (1.6 m)  Wt 90.719 kg  BMI 35.44 kg/m2  SpO2 100% Physical Exam  Constitutional: She appears well-developed and well-nourished. No distress.  HENT:  Head: Normocephalic and atraumatic.  Nose: Nose normal.  Mouth/Throat: Uvula is midline.  Eyes: Pupils are equal, round, and reactive to light.  Neck: Normal range of motion. Neck supple.  Cardiovascular: Normal rate and regular rhythm.   Pulmonary/Chest: Effort normal.  Abdominal: Soft. She exhibits distension. She exhibits no ascites. Bowel sounds are decreased. There is no hepatosplenomegaly. There is tenderness (diffuse, mild). There is no rigidity, no rebound, no guarding, no CVA tenderness and negative Murphy's sign.  Musculoskeletal:  No LE swelling  Neurological: She is alert.  Acting at baseline  Skin: Skin is warm and dry. No rash noted.  Nursing note and vitals reviewed.   ED Course  Procedures (including critical care time) Labs Review Labs Reviewed  POC URINE PREG, ED    Imaging Review Dg Abd 2 Views  12/14/2015  CLINICAL DATA:  Initial evaluation for constipation. Is able to pass gas. EXAM: ABDOMEN - 2 VIEW COMPARISON:  Prior study from 11/02/2009. FINDINGS: Bowel gas is distension of the colon within  the left abdomen. Gas is seen distally over the rectal vault. No significant small bowel dilatation. Moderate amount of retained stool within the proximal colon. Stool present within the rectal vault as well. No abnormal bowel wall thickening. No free air. Thin curvilinear lucency seen at the left upper quadrant favored to reflect a Mach line with the lung. No soft tissue mass or abnormal calcification. No acute osseus abnormality. Visualized lung bases are clear. Mild  elevation left hemidiaphragm. IMPRESSION: 1. Moderate amount of retained stool within the proximal colon and rectal vault. 2. Mild gaseous distension of the colon without radiographic features to suggest mechanical bowel obstruction. Electronically Signed   By: Jeannine Boga M.D.   On: 12/14/2015 02:26   I have personally reviewed and evaluated these images and lab results as part of my medical decision-making.   EKG Interpretation None      MDM   Final diagnoses:  Constipation    Abd xrays show moderate amount of retained stool within the proximal colon and rectal vault. She does not have any mechanical signs to suggest bowel obstruction. Will give Milk of Molasses in the ED.  5:00am - the patient was able to tolerate receiving the entire milk of molasses enema bag. She did not have a bowel movement but passed a significant amount of gas relieving some pressure.  Patient is nontoxic, nonseptic appearing, in no apparent distress.  No indication of appendicitis, bowel obstruction, bowel perforation, cholecystitis, diverticulitis or pregnancy.  Patient discharged home with fleet, sorbitol and miralax. Referral to [facility] given.    I have also discussed reasons to return immediately to the ER.  Patient expresses understanding and agrees with plan.     [name], PA-C 12/14/15 [id number]  Merryl Hacker, MD 12/14/15 [id number]

## 2016-11-10 ENCOUNTER — Emergency Department (HOSPITAL_BASED_OUTPATIENT_CLINIC_OR_DEPARTMENT_OTHER): Payer: Managed Care, Other (non HMO)

## 2016-11-10 ENCOUNTER — Emergency Department (HOSPITAL_BASED_OUTPATIENT_CLINIC_OR_DEPARTMENT_OTHER)
Admission: EM | Admit: 2016-11-10 | Discharge: 2016-11-10 | Disposition: A | Discharge status: Home / Self Care | Payer: Managed Care, Other (non HMO) | Attending: Emergency Medicine | Admitting: Emergency Medicine

## 2016-11-10 ENCOUNTER — Encounter (HOSPITAL_BASED_OUTPATIENT_CLINIC_OR_DEPARTMENT_OTHER): Payer: Self-pay | Admitting: *Deleted

## 2016-11-10 DIAGNOSIS — Z79899 Other long term (current) drug therapy: Secondary | ICD-10-CM | POA: Diagnosis not present

## 2016-11-10 DIAGNOSIS — G479 Sleep disorder, unspecified: Secondary | ICD-10-CM | POA: Insufficient documentation

## 2016-11-10 DIAGNOSIS — R350 Frequency of micturition: Secondary | ICD-10-CM | POA: Diagnosis not present

## 2016-11-10 DIAGNOSIS — R1084 Generalized abdominal pain: Secondary | ICD-10-CM | POA: Diagnosis present

## 2016-11-10 DIAGNOSIS — R0602 Shortness of breath: Secondary | ICD-10-CM | POA: Diagnosis not present

## 2016-11-10 DIAGNOSIS — K5900 Constipation, unspecified: Secondary | ICD-10-CM | POA: Insufficient documentation

## 2016-11-10 LAB — CBC WITH DIFFERENTIAL/PLATELET
BASOS ABS: 0 10*3/uL (ref 0.0–0.1)
Basophils Relative: 0 %
Eosinophils Absolute: 0.1 10*3/uL (ref 0.0–0.7)
Eosinophils Relative: 2 %
HEMATOCRIT: 36.4 % (ref 36.0–46.0)
HEMOGLOBIN: 11.9 g/dL — AB (ref 12.0–15.0)
LYMPHS PCT: 53 %
Lymphs Abs: 2.9 10*3/uL (ref 0.7–4.0)
MCH: 25.2 pg — ABNORMAL LOW (ref 26.0–34.0)
MCHC: 32.7 g/dL (ref 30.0–36.0)
MCV: 77.1 fL — AB (ref 78.0–100.0)
MONO ABS: 0.7 10*3/uL (ref 0.1–1.0)
MONOS PCT: 14 %
NEUTROS PCT: 31 %
Neutro Abs: 1.7 10*3/uL (ref 1.7–7.7)
Platelets: 215 10*3/uL (ref 150–400)
RBC: 4.72 MIL/uL (ref 3.87–5.11)
RDW: 13.2 % (ref 11.5–15.5)
WBC: 5.4 10*3/uL (ref 4.0–10.5)

## 2016-11-10 LAB — COMPREHENSIVE METABOLIC PANEL
ALBUMIN: 3.5 g/dL (ref 3.5–5.0)
ALK PHOS: 68 U/L (ref 38–126)
ALT: 15 U/L (ref 14–54)
AST: 16 U/L (ref 15–41)
Anion gap: 6 (ref 5–15)
BILIRUBIN TOTAL: 0.4 mg/dL (ref 0.3–1.2)
BUN: 9 mg/dL (ref 6–20)
CALCIUM: 9.2 mg/dL (ref 8.9–10.3)
CO2: 26 mmol/L (ref 22–32)
Chloride: 104 mmol/L (ref 101–111)
Creatinine, Ser: 1.09 mg/dL — ABNORMAL HIGH (ref 0.44–1.00)
GFR calc Af Amer: >60 mL/min (ref >60)
GLUCOSE: 90 mg/dL (ref 65–99)
Potassium: 4.3 mmol/L (ref 3.5–5.1)
Sodium: 136 mmol/L (ref 135–145)
TOTAL PROTEIN: 7.7 g/dL (ref 6.5–8.1)

## 2016-11-10 LAB — URINALYSIS, ROUTINE W REFLEX MICROSCOPIC
Bilirubin Urine: NEGATIVE
GLUCOSE, UA: NEGATIVE mg/dL
HGB URINE DIPSTICK: NEGATIVE
Ketones, ur: NEGATIVE mg/dL
LEUKOCYTES UA: NEGATIVE
Nitrite: NEGATIVE
Protein, ur: NEGATIVE mg/dL
SPECIFIC GRAVITY, URINE: 1.014 (ref 1.005–1.030)
pH: 7 (ref 5.0–8.0)

## 2016-11-10 LAB — PREGNANCY, URINE: Preg Test, Ur: NEGATIVE

## 2016-11-10 LAB — LIPASE, BLOOD: Lipase: 35 U/L (ref 11–51)

## 2016-11-10 LAB — I-STAT CG4 LACTIC ACID, ED: LACTIC ACID, VENOUS: 0.79 mmol/L (ref 0.5–1.9)

## 2016-11-10 MED ORDER — SODIUM CHLORIDE 0.9 % IV BOLUS (SEPSIS)
1000.0000 mL | Freq: Once | INTRAVENOUS | Status: AC
  Administered 2016-11-10: 1000 mL via INTRAVENOUS

## 2016-11-10 MED ORDER — ONDANSETRON HCL 4 MG/2ML IJ SOLN
4.0000 mg | Freq: Once | INTRAMUSCULAR | Status: AC
  Administered 2016-11-10: 4 mg via INTRAVENOUS
  Filled 2016-11-10: qty 2

## 2016-11-10 NOTE — ED Triage Notes (Signed)
Abdominal pain. Hx of IBS. No BM in 2 weeks. States she thinks she may be pregnant. Home test were negative. Fatigue.

## 2016-11-10 NOTE — ED Provider Notes (Signed)
[name] Note   CSN: [id number] Arrival date & time: 11/10/16  1844  By signing my name below, I, [name], attest that this documentation has been prepared under the direction and in the presence of [name], MD. Electronically Signed: [name], ED Scribe. 11/10/16. 8:59 PM.   History   Chief Complaint Chief Complaint  Patient presents with  . Abdominal Pain    HPI Julie Mathis is a 26 y.o. female with a PMHx of IBS, who presents to the Emergency Department complaining of severe abdominal pain. Pt notes no BM in 2 weeks. Pt describes pain as cramping and stabbing. She notes abdomen is swollen and hard. Associated sx include fatigue, SOB, nausea, urine frequency, lower back pain, sleep disturbance and passing gas. Sitting down alleviates her pain while standing for too long exacerbates her pain. She tried taking papaya enzymes and drinking a lot of water with no relief. No recent traumas. Was on Nexplanon, but recently had it removed. LMP ~ 2-3 weeks ago, spotting only. Pt denies vomiting, fever, chills, rash, hematuria, vaginal bleeding, rhinorrhea, cough, and congestion.  The history is provided by the patient. No language interpreter was used.  Abdominal Pain   This is a new problem. The current episode started more than 1 week ago. The problem occurs constantly. The problem has not changed since onset.The pain is located in the generalized abdominal region. The quality of the pain is cramping and sharp. The pain is severe. Associated symptoms include nausea, constipation and frequency. Pertinent negatives include fever, diarrhea, vomiting, dysuria and hematuria. The symptoms are aggravated by activity. Her past medical history is significant for irritable bowel syndrome.    Past Medical History:  Diagnosis Date  . Bilateral ovarian cysts   . Fibroids   . IBS (irritable bowel syndrome)     There are no active problems to display for  this patient.   History reviewed. No pertinent surgical history.  OB History    No data available       Home Medications    Prior to Admission medications   Medication Sig Start Date End Date Taking? Authorizing Provider  CLINDAMYCIN HCL PO Take by mouth.   Yes Historical Provider, MD  Etonogestrel (NEXPLANON [place]) Inject into the skin.    Historical Provider, MD  Multiple Vitamins-Minerals (HAIR/SKIN/NAILS PO) Take 1 tablet by mouth daily.    Historical Provider, MD  polyethylene glycol powder (MIRALAX) powder Take 255 g by mouth daily. 12/14/15   [name], PA-C  sodium phosphate (FLEET) 7-19 GM/118ML ENEM Place 133 mLs (1 enema total) rectally daily as needed for severe constipation. 12/14/15   [name], PA-C  sorbitol 70 % solution Take 15 mLs by mouth daily as needed. 12/14/15   [name], PA-C    Family History Family History  Problem Relation Age of Onset  . Cancer Paternal Grandmother     Social History Social History  Substance Use Topics  . Smoking status: Never Smoker  . Smokeless tobacco: Never Used  . Alcohol use Yes     Comment: every day     Allergies   Patient has no known allergies.   Review of Systems Review of Systems  Constitutional: Positive for fatigue. Negative for chills and fever.  HENT: Negative for congestion and rhinorrhea.   Respiratory: Positive for shortness of breath. Negative for cough.   Gastrointestinal: Positive for abdominal pain, constipation and nausea. Negative for diarrhea and vomiting.  Genitourinary: Positive for frequency.  Negative for dysuria, hematuria and vaginal discharge.  Musculoskeletal: Positive for back pain.  Skin: Negative for rash.  Psychiatric/Behavioral: Positive for sleep disturbance.  All other systems reviewed and are negative.    Physical Exam Updated Vital Signs BP 129/77   Pulse 82   Temp 98.5 F (36.9 C) (Oral)   Resp 20   Ht 5\' 4"  (1.626 m)   Wt 200 lb (90.7 kg)   SpO2 100%    BMI 34.33 kg/m   Physical Exam  Constitutional: She is oriented to person, place, and time. She appears well-developed and well-nourished. No distress.  HENT:  Head: Normocephalic and atraumatic.  Right Ear: Hearing normal.  Left Ear: Hearing normal.  Nose: Nose normal.  Mouth/Throat: Oropharynx is clear and moist and mucous membranes are normal.  Eyes: Conjunctivae and EOM are normal. Pupils are equal, round, and reactive to light.  Neck: Normal range of motion. Neck supple.  Cardiovascular: Regular rhythm, S1 normal and S2 normal.  Exam reveals no gallop and no friction rub.   No murmur heard. Pulmonary/Chest: Effort normal and breath sounds normal. No respiratory distress. She exhibits no tenderness.  Abdominal: Soft. Normal appearance and bowel sounds are normal. There is no hepatosplenomegaly. There is tenderness. There is no rebound, no guarding, no tenderness at McBurney's point and negative Murphy's sign. No hernia.  Musculoskeletal: Normal range of motion.  Neurological: She is alert and oriented to person, place, and time. She has normal strength. No cranial nerve deficit or sensory deficit. Coordination normal. GCS eye subscore is 4. GCS verbal subscore is 5. GCS motor subscore is 6.  Skin: Skin is warm, dry and intact. No rash noted. No cyanosis.  Psychiatric: She has a normal mood and affect. Her speech is normal and behavior is normal. Thought content normal.  Nursing note and vitals reviewed.    ED Treatments / Results  DIAGNOSTIC STUDIES: Oxygen Saturation is 100% on RA, normal by my interpretation.   COORDINATION OF CARE: 8:59 PM-Discussed next steps with pt. Pt verbalized understanding and is agreeable with the plan.    Labs (all labs ordered are listed, but only abnormal results are displayed) Labs Reviewed  CBC WITH DIFFERENTIAL/PLATELET - Abnormal; Notable for the following:       Result Value   Hemoglobin 11.9 (*)    MCV 77.1 (*)    MCH 25.2 (*)    All  other components within normal limits  COMPREHENSIVE METABOLIC PANEL - Abnormal; Notable for the following:    Creatinine, Ser 1.09 (*)    All other components within normal limits  URINALYSIS, ROUTINE W REFLEX MICROSCOPIC  PREGNANCY, URINE  LIPASE, BLOOD  I-STAT CG4 LACTIC ACID, ED    EKG  EKG Interpretation None       Radiology Dg Abdomen Acute W/chest  Result Date: 11/10/2016 CLINICAL DATA:  Diffuse abdominal pain and swelling. Duration 1 week. Nausea tonight. EXAM: DG ABDOMEN ACUTE W/ 1V CHEST COMPARISON:  12/14/2015 FINDINGS: Stable elevation of the left hemidiaphragm. No acute cardiopulmonary findings. Generous stool and gas volume within the colon. No evidence of bowel obstruction. No extraluminal air. No biliary or urinary calculi. IMPRESSION: Large volume colonic stool and air without evidence of bowel obstruction. No acute cardiopulmonary findings. Electronically Signed   By: Andreas Newport M.D.   On: 11/10/2016 22:00    Procedures Procedures (including critical care time)  Medications Ordered in ED Medications  sodium chloride 0.9 % bolus 1,000 mL (0 mLs Intravenous Stopped 11/10/16 2352)  ondansetron (  ZOFRAN) injection 4 mg (4 mg Intravenous Given 11/10/16 2150)     Initial Impression / Assessment and Plan / ED Course  I have reviewed the triage vital signs and the nursing notes.  Pertinent labs & imaging results that were available during my care of the patient were reviewed by me and considered in my medical decision making (see chart for details).     Kenishia Plack is a 26 y.o. female With a past medical history significant for Irbil bowel syndrome and chronic constipation who presents with abdominal pain and no bowel movement in two weeks.  History and exam are seen above.  On exam, patient has tenderness across her abdomen. Bowel sounds are present. No CVA tenderness. Lungs clear. Chest nontender. No focal neurologic deficits.  Given history and exam, suspect  constipation as cause of pain. Bowel obstruction considered. Patient had laboratory testing which was unremarkable. Patient is not pregnant. No UTI.  CT scanner is broken and shared decision-making conversation held with patient and decision made to obtain and acute abdominal series to look for obstruction. Patient understood that this was less sensitive or obstruction but could reveal large stool burden.  X-ray did reveal large stool burden and no evidence of obstruction. Suspect constipation as cause of symptoms.  Patient reports that she has MiraLAX at home but has not been taking it. Patient advised to start. Patient will also increase fiber in diet and if this fails, try an enema. Patient will also follow up with her PCP for ongoing constipation and pain management. Patient voiced understanding of the plan of care and understood return precautions. Patient discharged in good condition.    Final Clinical Impressions(s) / ED Diagnoses   Final diagnoses:  Generalized abdominal pain  Constipation, unspecified constipation type    New Prescriptions Discharge Medication List as of 11/10/2016 11:37 PM      I personally performed the services described in this documentation, which was scribed in my presence. The recorded information has been reviewed and is accurate.  Clinical Impression: 1. Generalized abdominal pain   2. Constipation, unspecified constipation type     Disposition: Discharge  Condition: Good  I have discussed the results, Dx and Tx plan with the pt(& family if present). He/she/they expressed understanding and agree(s) with the plan. Discharge instructions discussed at great length. Strict return precautions discussed and pt &/or family have verbalized understanding of the instructions. No further questions at time of discharge.    Discharge Medication List as of 11/10/2016 11:37 PM      Follow Up: [place] [address] [place] [id number] [phone] Schedule an appointment as soon as possible for a visit    [place] [address] [id number] [place] [phone]  If symptoms worsen       [name], MD 11/11/16 1546

## 2016-11-10 NOTE — Discharge Instructions (Signed)
Please take MiraLAX once a day to help with your constipation. If symptoms do not improve, try enema. If this does not help and your symptoms worsen, please return to the nearest emergency department. Please schedule a follow-up appointment with your PCP for further ongoing management of your symptoms.

## 2016-12-07 ENCOUNTER — Emergency Department (HOSPITAL_BASED_OUTPATIENT_CLINIC_OR_DEPARTMENT_OTHER)
Admission: EM | Admit: 2016-12-07 | Discharge: 2016-12-07 | Disposition: A | Discharge status: Home / Self Care | Payer: Managed Care, Other (non HMO) | Attending: Emergency Medicine | Admitting: Emergency Medicine

## 2016-12-07 ENCOUNTER — Encounter (HOSPITAL_BASED_OUTPATIENT_CLINIC_OR_DEPARTMENT_OTHER): Payer: Self-pay

## 2016-12-07 DIAGNOSIS — J069 Acute upper respiratory infection, unspecified: Secondary | ICD-10-CM | POA: Diagnosis not present

## 2016-12-07 DIAGNOSIS — R05 Cough: Secondary | ICD-10-CM | POA: Diagnosis present

## 2016-12-07 DIAGNOSIS — B9789 Other viral agents as the cause of diseases classified elsewhere: Secondary | ICD-10-CM

## 2016-12-07 LAB — URINALYSIS, ROUTINE W REFLEX MICROSCOPIC
Bilirubin Urine: NEGATIVE
Glucose, UA: NEGATIVE mg/dL
Hgb urine dipstick: NEGATIVE
KETONES UR: NEGATIVE mg/dL
LEUKOCYTES UA: NEGATIVE
NITRITE: NEGATIVE
PH: 7 (ref 5.0–8.0)
Protein, ur: NEGATIVE mg/dL
Specific Gravity, Urine: 1.003 — ABNORMAL LOW (ref 1.005–1.030)

## 2016-12-07 MED ORDER — OXYMETAZOLINE HCL 0.05 % NA SOLN
1.0000 | Freq: Once | NASAL | Status: AC
  Administered 2016-12-07: 1 via NASAL
  Filled 2016-12-07: qty 15

## 2016-12-07 MED ORDER — ACETAMINOPHEN 500 MG PO TABS
1000.0000 mg | ORAL_TABLET | Freq: Once | ORAL | Status: AC
  Administered 2016-12-07: 1000 mg via ORAL
  Filled 2016-12-07: qty 2

## 2016-12-07 MED ORDER — AZITHROMYCIN 250 MG PO TABS: 250.0000 mg | ORAL_TABLET | Freq: Every day | ORAL | 0 refills | Status: AC

## 2016-12-07 NOTE — ED Triage Notes (Signed)
C/o flu like sx x 3 days-NAD-steady gait 

## 2016-12-07 NOTE — ED Provider Notes (Signed)
Julie Mathis   CSN: [id number] Arrival date & time: 12/07/16  1842 By signing my name below, I, Julie Mathis, attest that this documentation has been prepared under the direction and in the presence of Julie Dessert, MD . Electronically Signed: Fabian Mathis, ED Scribe. 12/07/2016. 7:18 PM.  History   Chief Complaint Chief Complaint  Patient presents with  . Cough    HPI Julie Mathis is a 26 y.o. female who presents to the Emergency Department complaining of frequent postnasal drainage onset two days ago. Pt reports associated sore throat, associated frontal sinus pressure, pedal edema, mild cough, abdominal tenderness, rhinorrhea, congestion, sneezing, chills, generalized body aches and urinary frequency. Pt states that two days ago she noticed sinus drainage in the back of throat. One day ago, pt reports she could not stop sneezing. Pt had difficulty breathing upon awakening today due to congestion. Pt reports that chills began today. Her cough was initially productive of blood tinged sputum. Per pt, her rhinorrhea was initially clear and is now yellow.Pt treated symptoms with hot tea and honey, water, sinus cold medicine, and OTC allergy medication with no improvement. No Tylenol taken PTA. The patient is currently on no regular medications.  Pt denies dysuria, nausea, vomiting, or ear pressure.    The history is provided by the patient. No language interpreter was used.   Past Medical History:  Diagnosis Date  . Bilateral ovarian cysts   . Fibroids   . IBS (irritable bowel syndrome)     There are no active problems to display for this patient.   History reviewed. No pertinent surgical history.  OB History    No data available       Home Medications    Prior to Admission medications   Not on File    Family History Family History  Problem Relation Age of Onset  . Cancer Paternal Grandmother     Social History Social History    Substance Use Topics  . Smoking status: Never Smoker  . Smokeless tobacco: Never Used  . Alcohol use Yes     Comment: daily    Allergies   Patient has no known allergies.   Review of Systems Review of Systems All systems reviewed and are negative for acute change except as noted in the HPI.  Physical Exam Updated Vital Signs BP (!) 124/91 (BP Location: Right Arm)   Pulse (!) 105   Temp 100.3 F (37.9 C) (Oral)   Resp 18   Ht 5\' 4"  (1.626 m)   Wt 209 lb (94.8 kg)   LMP 11/12/2016   SpO2 100%   BMI 35.87 kg/m   Physical Exam  Constitutional: She appears well-developed and well-nourished. No distress.  HENT:  Head: Normocephalic and atraumatic.  Right Ear: Tympanic membrane normal.  Left Ear: Tympanic membrane normal.  Swollen nasal turbinates with rhinorrhea bilaterally.  Eyes: Conjunctivae are normal.  Neck: Normal range of motion.  Cardiovascular: Normal rate, regular rhythm and normal heart sounds.  Exam reveals no gallop and no friction rub.   No murmur heard. Pulmonary/Chest: Effort normal and breath sounds normal. No respiratory distress. She has no wheezes. She has no rales. She exhibits no tenderness.  Abdominal: She exhibits no distension.  Musculoskeletal: Normal range of motion.  Neurological: She is alert.  Skin: No pallor.  Psychiatric: She has a normal mood and affect. Her behavior is normal.  Nursing Mathis and vitals reviewed.   ED Treatments / Results  DIAGNOSTIC STUDIES:  Oxygen Saturation is 100% on RA, normal by my interpretation.    COORDINATION OF CARE:  7:30 PM Will order UA. Discussed treatment plan with pt at bedside and pt agreed to plan.  Labs (all labs ordered are listed, but only abnormal results are displayed) Labs Reviewed  URINALYSIS, ROUTINE W REFLEX MICROSCOPIC - Abnormal; Notable for the following:       Result Value   Specific Gravity, Urine 1.003 (*)    All other components within normal limits    EKG  EKG  Interpretation None       Radiology No results found.  Procedures Procedures (including critical care time)  Medications Ordered in ED Medications  acetaminophen (TYLENOL) tablet 1,000 mg (1,000 mg Oral Given 12/07/16 1911)     Initial Impression / Assessment and Plan / ED Course  I have reviewed the triage vital signs and the nursing notes.  Pertinent labs & imaging results that were available during my care of the patient were reviewed by me and considered in my medical decision making (see chart for details).     Pt with symptoms consistent with viral URI/sinusitis.  Well appearing here.  No signs of breathing difficulty  No signs of pharyngitis, otitis or abnormal abdominal findings.   Discussed with patient this is most likely viral. Recommended ibuprofen, Tylenol and nasal spray. However patient given prescription for azithromycin to start in 3-4 days if symptoms worsen.   Final Clinical Impressions(s) / ED Diagnoses   Final diagnoses:  Viral URI with cough    New Prescriptions Discharge Medication List as of 12/07/2016  8:18 PM    START taking these medications   Details  azithromycin (ZITHROMAX) 250 MG tablet Take 1 tablet (250 mg total) by mouth daily. Take first 2 tablets together, then 1 every day until finished.  Start in 2-3 days if still having fever and symptoms, Starting Wed 12/07/2016, Print        I personally performed the services described in this documentation, which was scribed in my presence.  The recorded information has been reviewed and considered.     Julie Dessert, MD 12/07/16 2102

## 2017-01-24 ENCOUNTER — Encounter: Payer: Self-pay | Admitting: Internal Medicine

## 2017-03-23 ENCOUNTER — Ambulatory Visit: Payer: Managed Care, Other (non HMO) | Admitting: Internal Medicine

## 2017-03-31 ENCOUNTER — Encounter (INDEPENDENT_AMBULATORY_CARE_PROVIDER_SITE_OTHER): Payer: Self-pay

## 2017-03-31 ENCOUNTER — Ambulatory Visit: Payer: Managed Care, Other (non HMO) | Admitting: Nurse Practitioner

## 2017-07-28 IMAGING — CR DG ABDOMEN ACUTE W/ 1V CHEST
3 series · 3 of 3 positions shown · non-contrast
Comparison: 12/14/2015

CLINICAL DATA: Diffuse abdominal pain and swelling. Duration 1
week. Nausea tonight.

EXAM:
DG ABDOMEN ACUTE W/ 1V CHEST

[w chest pa]
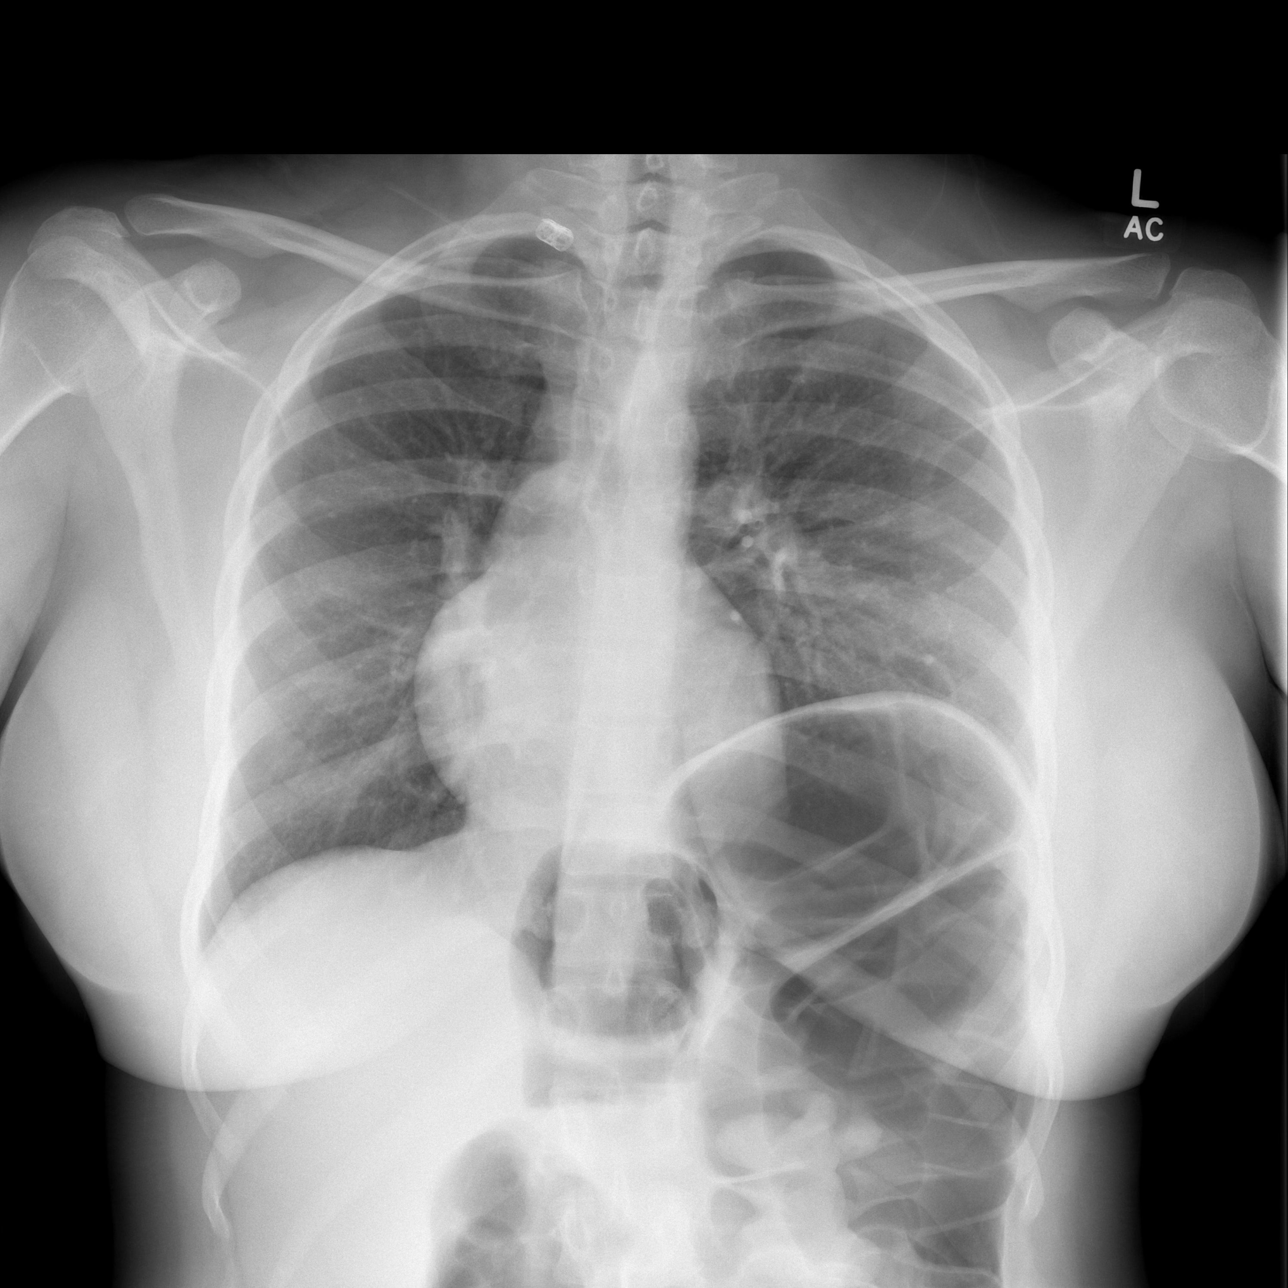

[w abdomen upright]
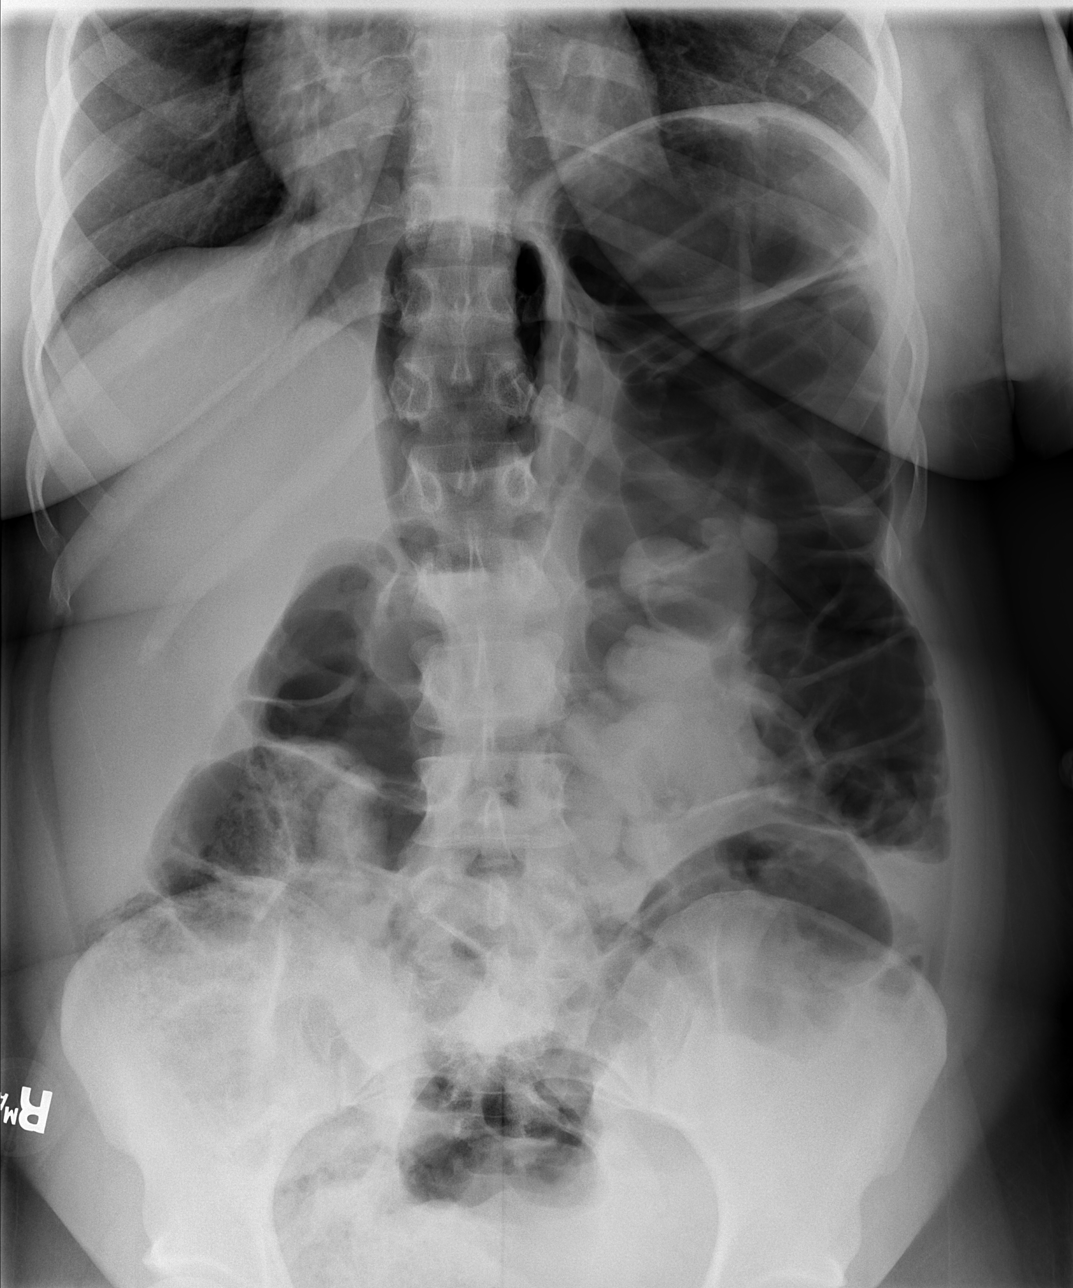

[t abdomen supine *]
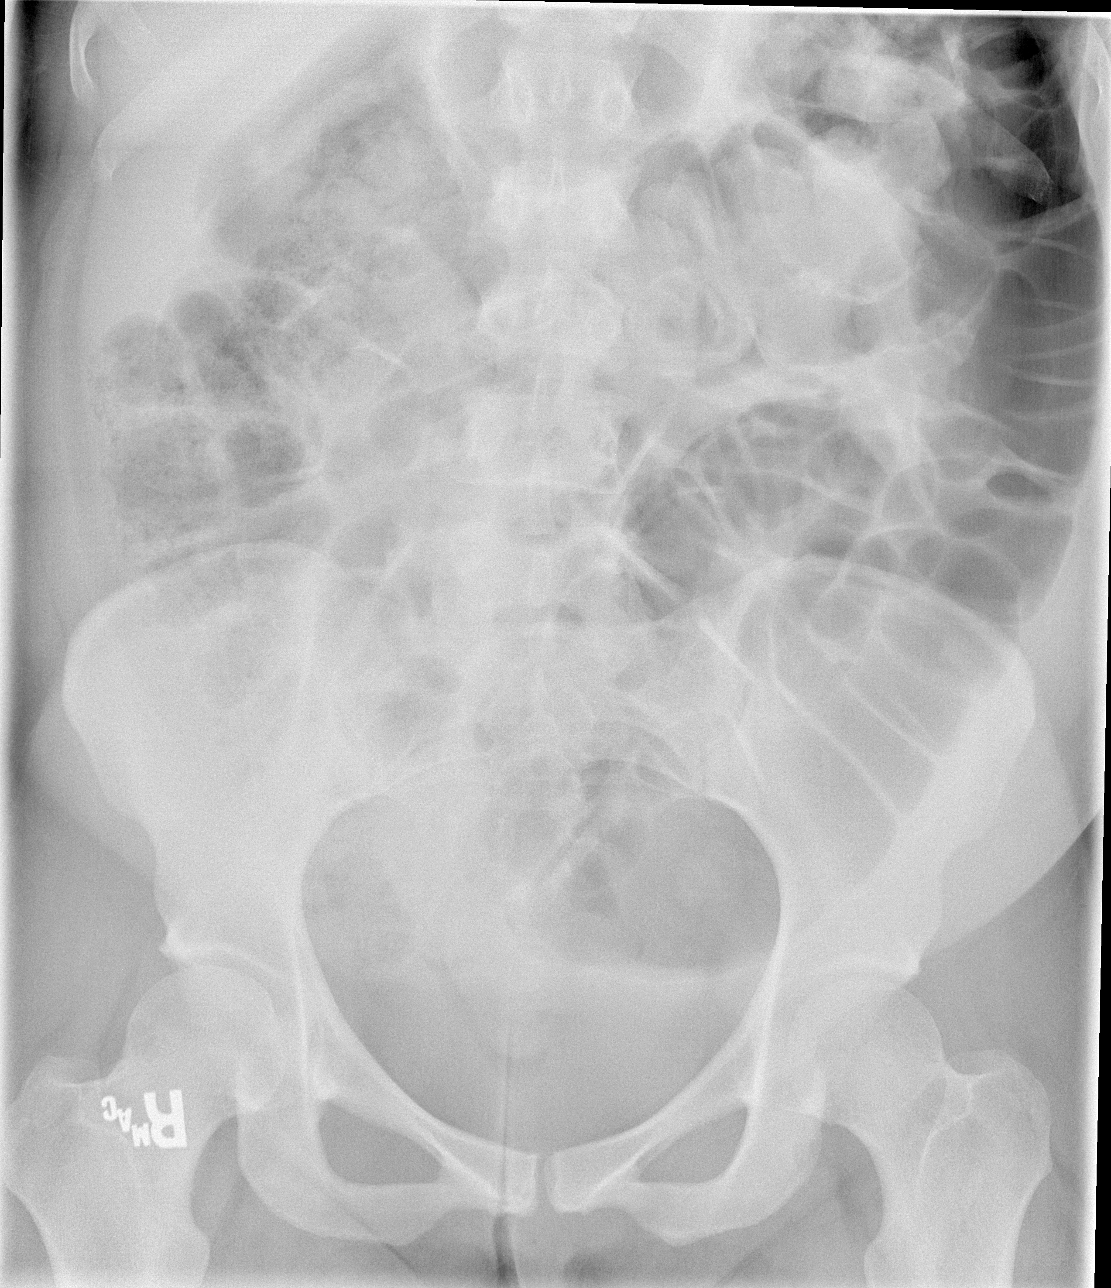

[3 of 3 positions shown; findings below may reference images not displayed]

FINDINGS: Stable elevation of the left hemidiaphragm. No acute cardiopulmonary
findings. Generous stool and gas volume within the colon. No
evidence of bowel obstruction. No extraluminal air. No biliary or
urinary calculi.
IMPRESSION: Large volume colonic stool and air without evidence of bowel
obstruction. No acute cardiopulmonary findings.

## 2017-10-31 ENCOUNTER — Encounter (HOSPITAL_BASED_OUTPATIENT_CLINIC_OR_DEPARTMENT_OTHER): Payer: Self-pay | Admitting: *Deleted

## 2017-10-31 ENCOUNTER — Other Ambulatory Visit: Payer: Self-pay

## 2017-10-31 ENCOUNTER — Emergency Department (HOSPITAL_BASED_OUTPATIENT_CLINIC_OR_DEPARTMENT_OTHER)
Admission: EM | Admit: 2017-10-31 | Discharge: 2017-10-31 | Disposition: A | Discharge status: Home / Self Care | Payer: Managed Care, Other (non HMO) | Attending: Emergency Medicine | Admitting: Emergency Medicine

## 2017-10-31 DIAGNOSIS — R51 Headache: Secondary | ICD-10-CM | POA: Diagnosis present

## 2017-10-31 DIAGNOSIS — R110 Nausea: Secondary | ICD-10-CM | POA: Insufficient documentation

## 2017-10-31 DIAGNOSIS — R519 Headache, unspecified: Secondary | ICD-10-CM

## 2017-10-31 DIAGNOSIS — R1031 Right lower quadrant pain: Secondary | ICD-10-CM | POA: Diagnosis not present

## 2017-10-31 MED ORDER — PROCHLORPERAZINE MALEATE 10 MG PO TABS: 10.0000 mg | ORAL_TABLET | Freq: Two times a day (BID) | ORAL | 0 refills | Status: AC | PRN

## 2017-10-31 MED ORDER — DEXAMETHASONE SODIUM PHOSPHATE 10 MG/ML IJ SOLN
10.0000 mg | Freq: Once | INTRAMUSCULAR | Status: AC
  Administered 2017-10-31: 10 mg via INTRAVENOUS
  Filled 2017-10-31: qty 1

## 2017-10-31 MED ORDER — SODIUM CHLORIDE 0.9 % IV BOLUS
1000.0000 mL | Freq: Once | INTRAVENOUS | Status: AC
  Administered 2017-10-31: 1000 mL via INTRAVENOUS

## 2017-10-31 MED ORDER — PROCHLORPERAZINE EDISYLATE 10 MG/2ML IJ SOLN
10.0000 mg | Freq: Once | INTRAMUSCULAR | Status: AC
  Administered 2017-10-31: 10 mg via INTRAVENOUS
  Filled 2017-10-31: qty 2

## 2017-10-31 MED ORDER — DIPHENHYDRAMINE HCL 50 MG/ML IJ SOLN
25.0000 mg | Freq: Once | INTRAMUSCULAR | Status: AC
  Administered 2017-10-31: 25 mg via INTRAVENOUS
  Filled 2017-10-31: qty 1

## 2017-10-31 NOTE — ED Provider Notes (Signed)
[facility] EMERGENCY DEPARTMENT Provider Note   CSN: [id number] Arrival date & time: 10/31/17  1756     History   Chief Complaint Chief Complaint  Patient presents with  . Headache    HPI Julie Mathis is a 27 y.o. female.  The history is provided by the patient and medical records. No language interpreter was used.  Headache   This is a new problem. The current episode started 12 to 24 hours ago. The problem occurs constantly. The problem has been gradually worsening. The headache is associated with bright light and loud noise. The pain is located in the right unilateral region. The quality of the pain is described as dull and throbbing. The pain is at a severity of 10/10. The pain is severe. The pain does not radiate. Associated symptoms include nausea. Pertinent negatives include no anorexia, no fever, no malaise/fatigue, no chest pressure, no near-syncope, no orthopnea, no palpitations, no syncope, no shortness of breath and no vomiting. She has tried NSAIDs for the symptoms. The treatment provided no relief.    Past Medical History:  Diagnosis Date  . Bilateral ovarian cysts   . Fibroids   . IBS (irritable bowel syndrome)     There are no active problems to display for this patient.   History reviewed. No pertinent surgical history.   OB History   None      Home Medications    Prior to Admission medications   Medication Sig Start Date End Date Taking? Authorizing Provider  azithromycin (ZITHROMAX) 250 MG tablet Take 1 tablet (250 mg total) by mouth daily. Take first 2 tablets together, then 1 every day until finished.  Start in 2-3 days if still having fever and symptoms 12/07/16   Blanchie Dessert, MD    Family History Family History  Problem Relation Age of Onset  . Cancer Paternal Grandmother     Social History Social History   Tobacco Use  . Smoking status: Never Smoker  . Smokeless tobacco: Never Used  Substance Use Topics  . Alcohol  use: Yes    Comment: daily  . Drug use: No     Allergies   Patient has no known allergies.   Review of Systems Review of Systems  Constitutional: Negative for chills, diaphoresis, fatigue, fever and malaise/fatigue.  HENT: Negative for congestion.   Eyes: Positive for photophobia. Negative for visual disturbance.  Respiratory: Negative for cough, chest tightness, shortness of breath, wheezing and stridor.   Cardiovascular: Negative for palpitations, orthopnea, syncope and near-syncope.  Gastrointestinal: Positive for nausea. Negative for abdominal pain, anorexia and vomiting.  Genitourinary: Negative for flank pain.  Musculoskeletal: Negative for back pain, neck pain and neck stiffness.  Skin: Negative for rash and wound.  Neurological: Positive for headaches. Negative for seizures, syncope, facial asymmetry, speech difficulty, weakness, light-headedness and numbness.  All other systems reviewed and are negative.    Physical Exam Updated Vital Signs BP 120/87 (BP Location: Right Arm)   Pulse 74   Temp 98.5 F (36.9 C) (Oral)   Resp 18   Ht 5\' 5"  (1.651 m)   Wt 96.6 kg (213 lb)   LMP 10/29/2017   SpO2 100%   BMI 35.45 kg/m   Physical Exam  Constitutional: She is oriented to person, place, and time. She appears well-developed and well-nourished.  Non-toxic appearance. No distress.  HENT:  Head: Normocephalic and atraumatic.  Nose: Nose normal.  Mouth/Throat: Oropharynx is clear and moist. No oropharyngeal exudate.  Eyes: Pupils are  equal, round, and reactive to light. Conjunctivae and EOM are normal. No scleral icterus.  Neck: Normal range of motion. Neck supple.  Cardiovascular: Normal rate, regular rhythm and intact distal pulses.  No murmur heard. Pulmonary/Chest: Effort normal and breath sounds normal. No respiratory distress. She has no wheezes. She exhibits no tenderness.  Abdominal: Soft. She exhibits no distension. There is no tenderness.  Musculoskeletal:  She exhibits no edema or tenderness.  Lymphadenopathy:    She has no cervical adenopathy.  Neurological: She is alert and oriented to person, place, and time. No cranial nerve deficit or sensory deficit. She exhibits normal muscle tone. Coordination normal.  Skin: Skin is warm and dry. Capillary refill takes less than 2 seconds. No rash noted. She is not diaphoretic. No erythema.  Psychiatric: She has a normal mood and affect.  Nursing note and vitals reviewed.    ED Treatments / Results  Labs (all labs ordered are listed, but only abnormal results are displayed) Labs Reviewed - No data to display  EKG None  Radiology No results found.  Procedures Procedures (including critical care time)  Medications Ordered in ED Medications  sodium chloride 0.9 % bolus 1,000 mL (0 mLs Intravenous Stopped 10/31/17 2129)  prochlorperazine (COMPAZINE) injection 10 mg (10 mg Intravenous Given 10/31/17 1848)  diphenhydrAMINE (BENADRYL) injection 25 mg (25 mg Intravenous Given 10/31/17 1848)  dexamethasone (DECADRON) injection 10 mg (10 mg Intravenous Given 10/31/17 1849)     Initial Impression / Assessment and Plan / ED Course  I have reviewed the triage vital signs and the nursing notes.  Pertinent labs & imaging results that were available during my care of the patient were reviewed by me and considered in my medical decision making (see chart for details).     Julie Mathis is a 27 y.o. female with a past medical history significant for IBS, uterine fibroids, and ovarian cyst who presents with headache.  Patient reports that she did not get much sleep last night and then woke up this morning with headache.  She reports that her headache was mild in the morning but has gradually worsened throughout the day.  It is right-sided and it is severe.  She reports it is sharp and is now "11 out of 10" in severity.  She reports no visual changes but does report some mild nausea.  She denies any emesis.  She  denies any fevers, chills, chest pain, or shortness of breath.  She denies any recent trauma or neck manipulation or injuries.  She reports associated photophobia and phonophobia.  She reports that this is worse than her normal headaches that she takes over-the-counter medications for.  She denies any medication allergies.  Next  On exam, patient could move her neck without nuchal rigidity or significant neck pain.  Patient had clear lungs and chest was nontender.  No focal neurologic deficits in regards to sensation, strength and coordination.  Pupils are reactive bilaterally and symmetric.  Patient had no facial droop.  Patient's exam was overall reassuring.  Suspect migraine is the cause of her headache given the photophobia phonophobia and description of symptoms.  Given her lack of neck symptoms or fevers, do not feel patient has meningitis.  Doubt dissection given the lack of trauma or neck injuries.  Next  Patient will be given a cocktail of medications to try to alleviate her symptoms.  Anticipate reassessment after medications.  Patient reports her headache went from 11 out of 10 to a 3 out of 10.  She is feeling much better and was able to tolerate p.o.  Patient feels she is ready to go home.  Next  Suspect migraine headache causing the patient's symptoms.  Patient understood follow-up instructions and return precautions.  Patient will stay hydrated.  Patient had no other questions or concerns and was discharged in good condition.   Final Clinical Impressions(s) / ED Diagnoses   Final diagnoses:  Bad headache    ED Discharge Orders        Ordered    prochlorperazine (COMPAZINE) 10 MG tablet  2 times daily PRN     10/31/17 2322      Clinical Impression: 1. Bad headache     Disposition: Discharge  Condition: Good  I have discussed the results, Dx and Tx plan with the pt(& family if present). He/she/they expressed understanding and agree(s) with the plan. Discharge  instructions discussed at great length. Strict return precautions discussed and pt &/or family have verbalized understanding of the instructions. No further questions at time of discharge.    New Prescriptions   PROCHLORPERAZINE (COMPAZINE) 10 MG TABLET    Take 1 tablet (10 mg total) by mouth 2 (two) times daily as needed for nausea or vomiting.    Follow Up: [address] [place] [id number] [phone] Schedule an appointment as soon as possible for a visit    [facility] [address] [id number] [place] [facility] [place] [phone]       Temperance Kelemen, Gwenyth Allegra, MD 10/31/17 [phone]

## 2017-10-31 NOTE — ED Notes (Signed)
PO challenge started

## 2017-10-31 NOTE — Discharge Instructions (Signed)
Your exam today was reassuring and you responded well to the pain medications.  We do not feel you have an infection, bleed, or other emergent cause of your headaches.  Please take the nausea medicine to help with your headaches.  Please stay hydrated.  Please follow-up with your primary doctor.  If any symptoms change or worsen, please return to the nearest emergency department.

## 2017-10-31 NOTE — ED Triage Notes (Signed)
Pt reports ha x this am, did not sleep well last night. States pain is to right side of her face front and back of her head, "aching real bad". Smile is symmetrical, pearla, moe + x 4 ext, grips are = and strong.
